# Patient Record
Sex: Male | Born: 1985 | Race: White | Hispanic: No | Smoking: Never smoker
Health system: Southern US, Community
[De-identification: ages and names within clinical notes are randomized; demographics above are authoritative.]

---

## 2006-06-25 ENCOUNTER — Encounter: Admission: RE | Admit: 2006-06-25 | Discharge: 2006-06-25 | Payer: Self-pay | Admitting: Sports Medicine

## 2008-09-17 IMAGING — RF DG FLUORO GUIDE NDL PLC/BX
5 series · 5 of 5 positions shown · IV contrast (magnevist)
Comparison: none

CLINICAL DATA: The patient is a pitcher.  Has elbow pain. 
 RIGHT ELBOW INJECTION PRIOR TO MR: 
 Using fluoroscopic guidance and sterile technique, a 25 gauge needle was placed into the elbow along the radial capitellar articulation.  The joint was injected with a 10 cc mixture containing 5 cc Hypaque 60 contrast and 5 cc 1% Lidocaine and .1 cc Magnevist.  The elbow was evaluated through flexion and extension.  I question whether there is an ulnar collateral ligament tear.  MR to follow.

[Series 1: (hospital) · 1 of 1 slices shown (1 of 2)]
[im 1/1]
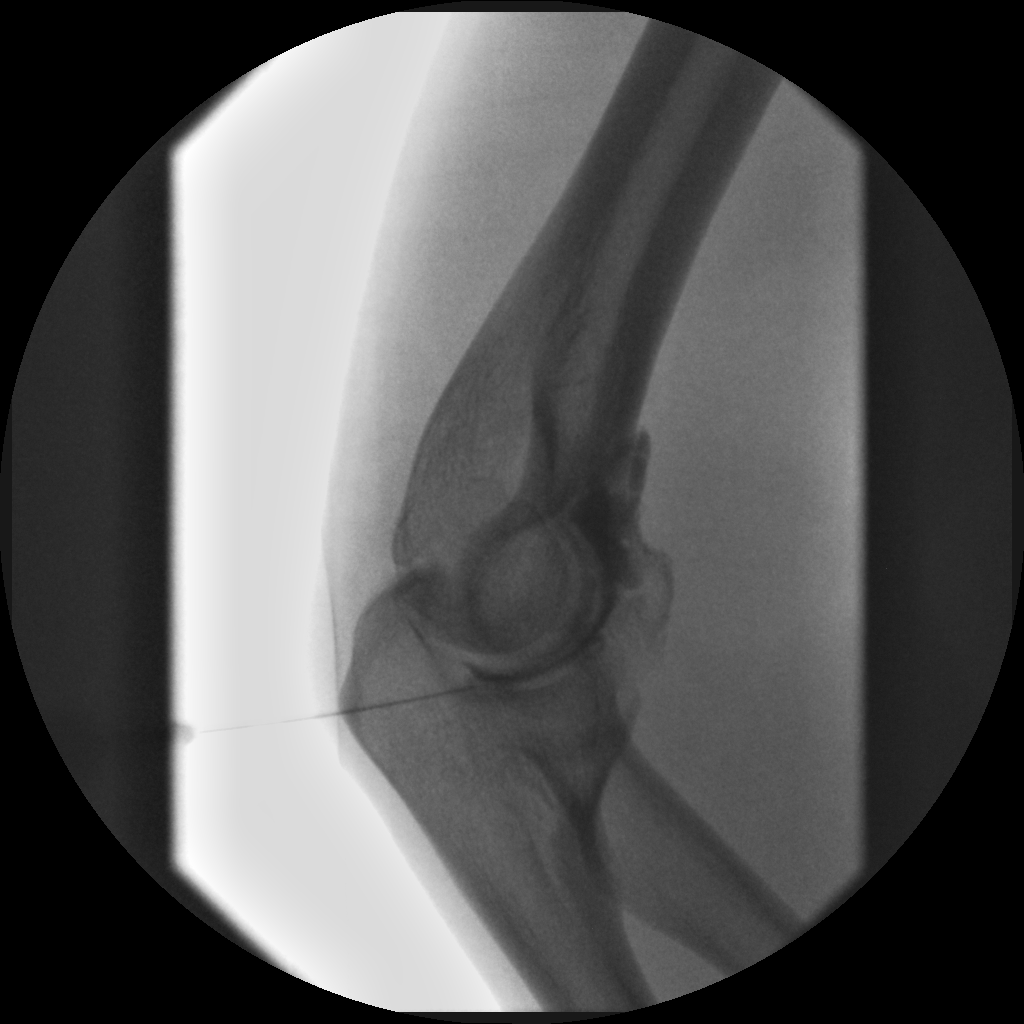

[Series 2: arthrogram · 1 of 1 slices shown (1 of 3)]
[im 1/1]
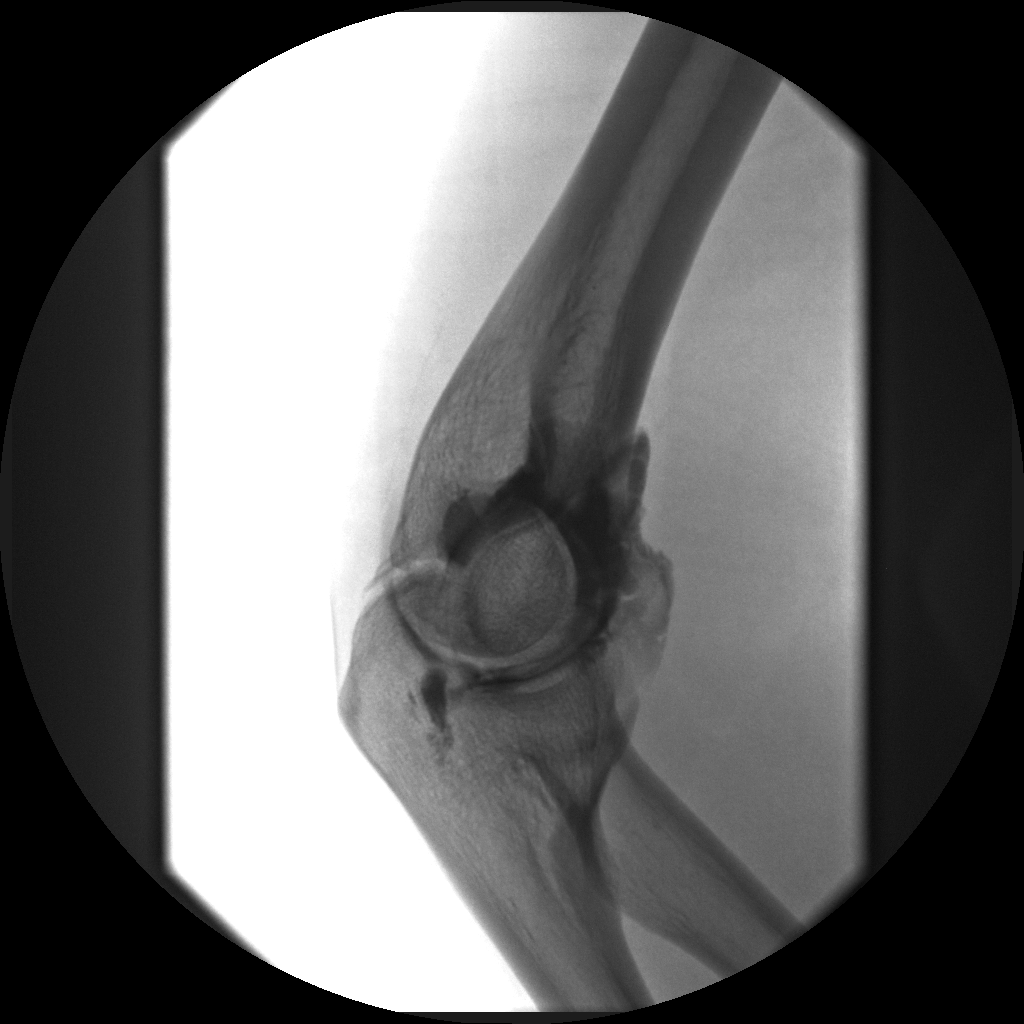

[Series 3: arthrogram · 1 of 1 slices shown (2 of 3)]
[im 1/1]
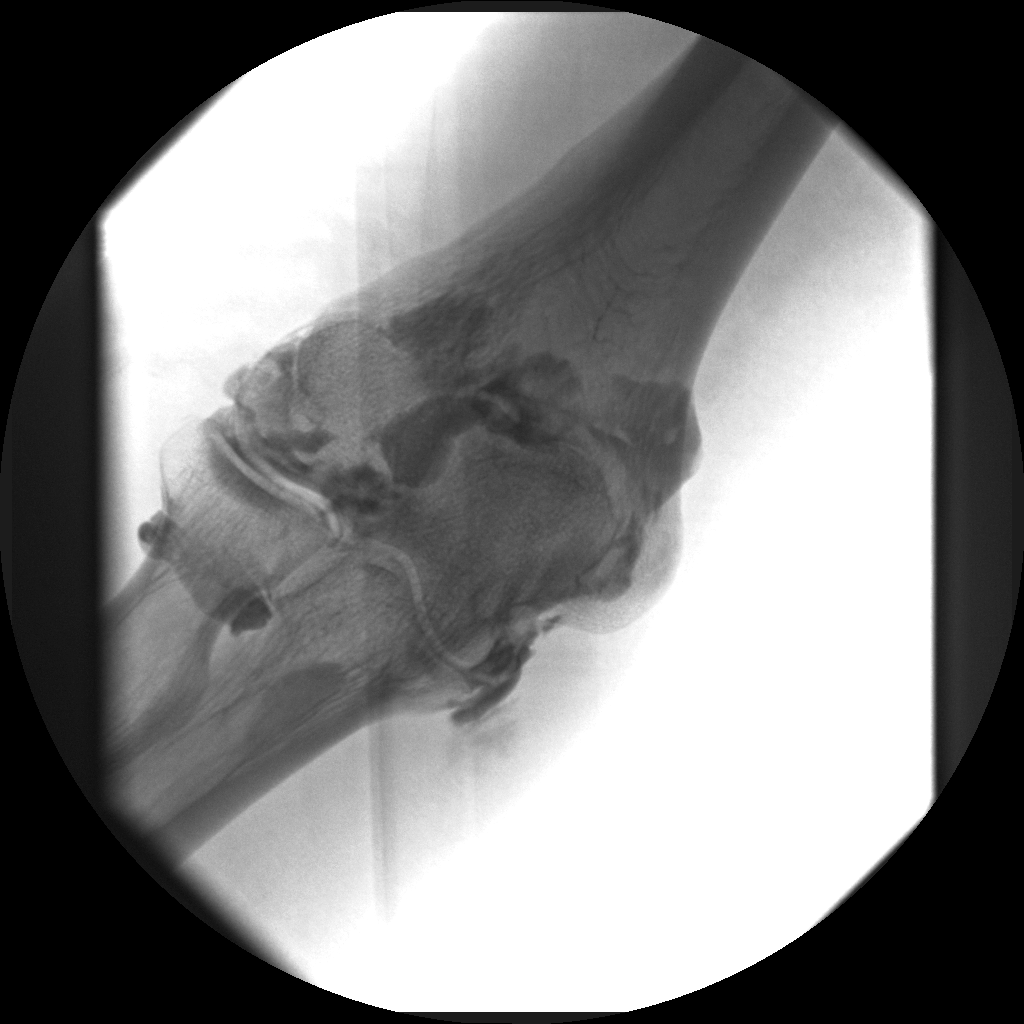

[Series 4: arthrogram · 1 of 1 slices shown (3 of 3)]
[im 1/1]
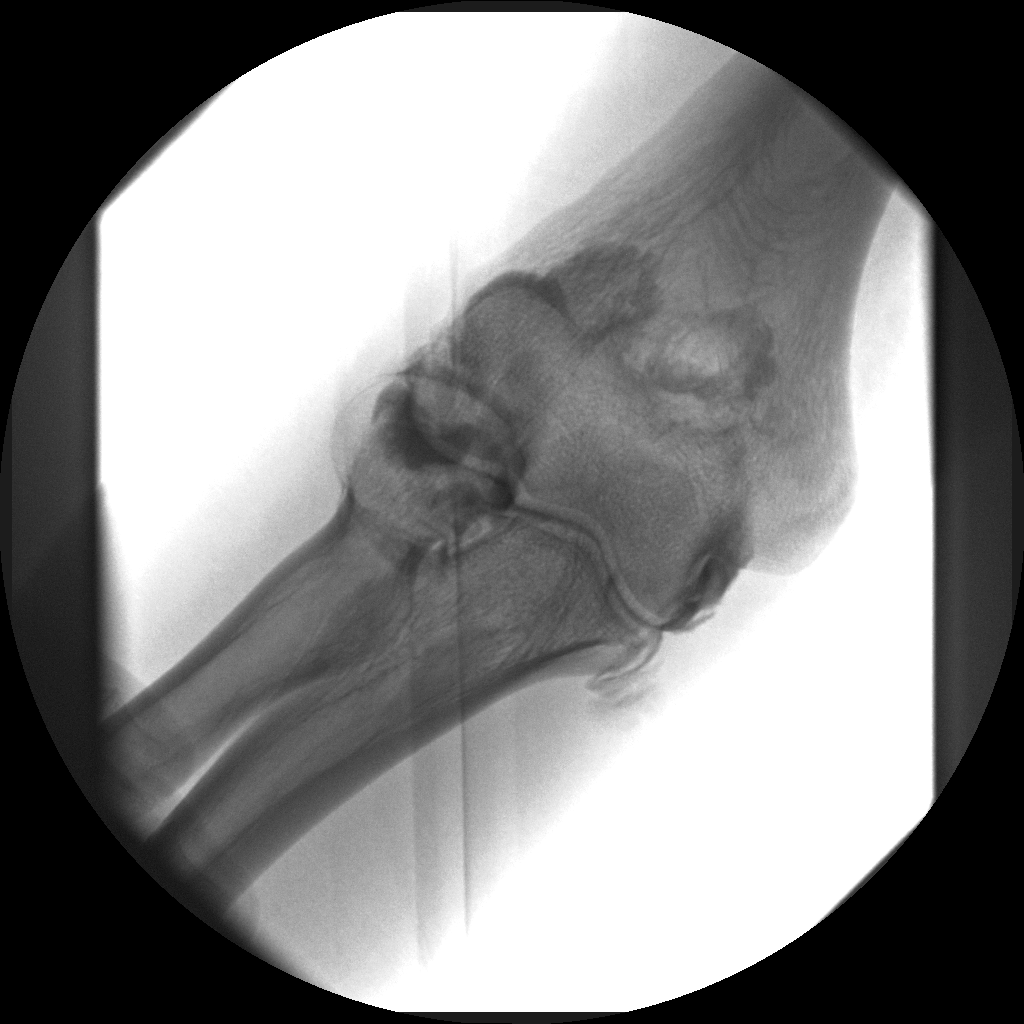

[Series 5: (hospital) · 1 of 1 slices shown (2 of 2)]
[im 1/1]
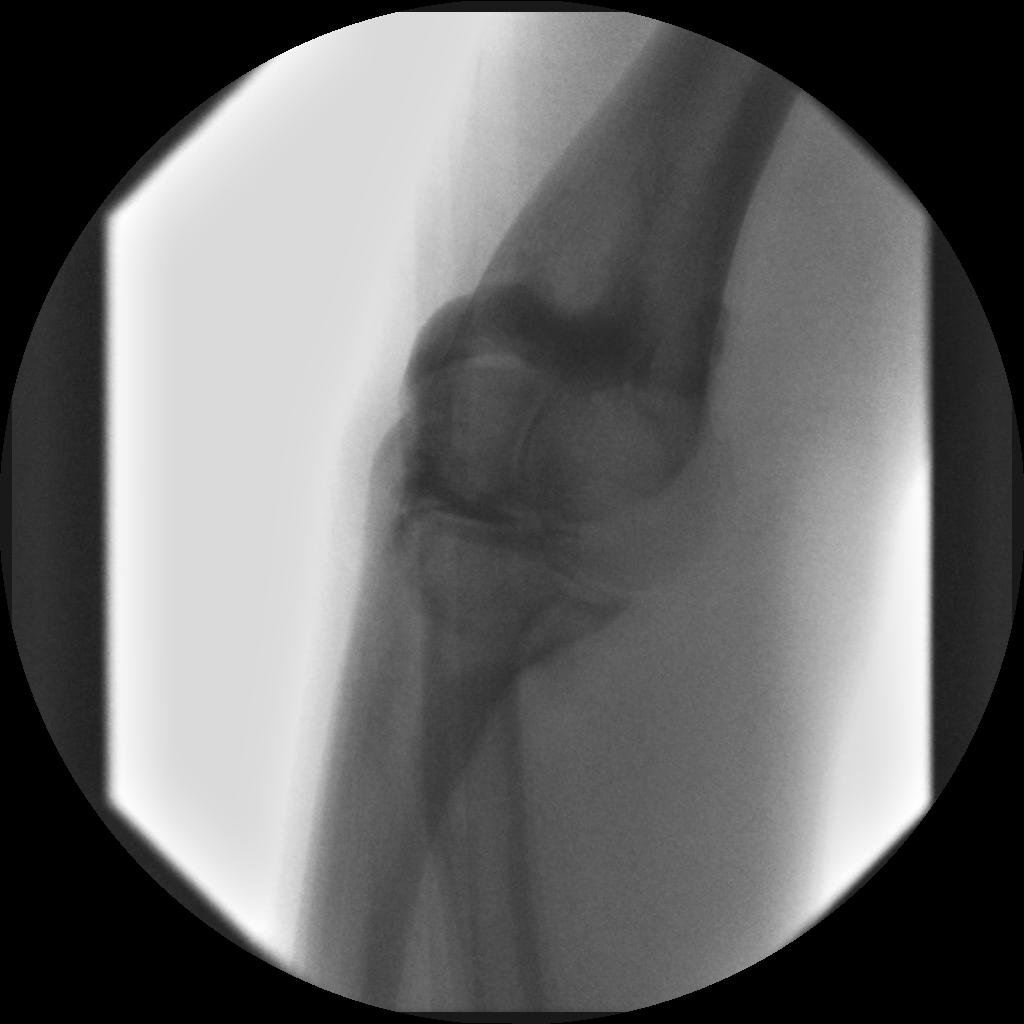

[5 of 5 positions shown; findings below may reference images not displayed]

IMPRESSION: INJECTION OF THE RIGHT ELBOW 
 Technically successful.  MR to follow.

## 2016-06-14 ENCOUNTER — Encounter (HOSPITAL_COMMUNITY): Payer: Self-pay | Admitting: Emergency Medicine

## 2016-06-14 ENCOUNTER — Ambulatory Visit (HOSPITAL_COMMUNITY)
Admission: EM | Admit: 2016-06-14 | Discharge: 2016-06-14 | Disposition: A | Discharge status: Home / Self Care | Payer: Managed Care, Other (non HMO)

## 2016-06-14 DIAGNOSIS — R509 Fever, unspecified: Secondary | ICD-10-CM

## 2016-06-14 DIAGNOSIS — R6889 Other general symptoms and signs: Secondary | ICD-10-CM

## 2016-06-14 MED ORDER — OSELTAMIVIR PHOSPHATE 75 MG PO CAPS: 75.0000 mg | ORAL_CAPSULE | Freq: Two times a day (BID) | ORAL | 0 refills | Status: AC

## 2016-06-14 NOTE — ED Provider Notes (Signed)
CSN: [id number]     Arrival date & time 06/14/16  1720 History   First MD Initiated Contact with Patient 06/14/16 1746     Chief Complaint  Patient presents with  . flu symptoms   (Consider location/radiation/quality/duration/timing/severity/associated sxs/prior Treatment) Patient has fever, chills, myalgias, and arthralgias.  Patient developed these sx's last night.  He has been exposed to family who have flu.   The history is provided by the patient.  Fever  Temp source:  Subjective Severity:  Moderate Onset quality:  Sudden Duration:  24 hours Timing:  Constant Progression:  Worsening Chronicity:  New Relieved by:  Nothing Worsened by:  Nothing Associated symptoms: myalgias     History reviewed. No pertinent past medical history. History reviewed. No pertinent surgical history. History reviewed. No pertinent family history. Social History  Substance Use Topics  . Smoking status: Never Smoker  . Smokeless tobacco: Never Used  . Alcohol use Yes    Review of Systems  Constitutional: Positive for fever.  HENT: Negative.   Eyes: Negative.   Respiratory: Negative.   Cardiovascular: Negative.   Gastrointestinal: Negative.   Endocrine: Negative.   Genitourinary: Negative.   Musculoskeletal: Positive for arthralgias and myalgias.  Skin: Negative.   Allergic/Immunologic: Negative.   Neurological: Negative.   Hematological: Negative.   Psychiatric/Behavioral: Negative.     Allergies  Patient has no known allergies.  Home Medications   Prior to Admission medications   Medication Sig Start Date End Date Taking? Authorizing Provider  acetaminophen (TYLENOL) 325 MG tablet Take 650 mg by mouth every 6 (six) hours as needed for fever.   Yes Historical Provider, MD  oseltamivir (TAMIFLU) 75 MG capsule Take 1 capsule (75 mg total) by mouth every 12 (twelve) hours. 06/14/16   Deatra Canter, FNP   Meds Ordered and Administered this Visit  Medications - No data to  display  BP 123/84 (BP Location: Right Arm)   Pulse 92   Temp 99.9 F (37.7 C) (Oral)   SpO2 95%  No data found.   Physical Exam  Constitutional: He is oriented to person, place, and time. He appears well-developed and well-nourished.  HENT:  Head: Normocephalic and atraumatic.  Right Ear: External ear normal.  Left Ear: External ear normal.  Mouth/Throat: Oropharynx is clear and moist.  Eyes: Conjunctivae and EOM are normal. Pupils are equal, round, and reactive to light.  Neck: Normal range of motion. Neck supple.  Cardiovascular: Normal rate, regular rhythm and normal heart sounds.   Pulmonary/Chest: Effort normal and breath sounds normal.  Abdominal: Soft.  Musculoskeletal: Normal range of motion.  Neurological: He is alert and oriented to person, place, and time.  Nursing note and vitals reviewed.   Urgent Care Course     Procedures (including critical care time)  Labs Review Labs Reviewed - No data to display  Imaging Review No results found.   Visual Acuity Review  Right Eye Distance:   Left Eye Distance:   Bilateral Distance:    Right Eye Near:   Left Eye Near:    Bilateral Near:         MDM   1. Flu-like symptoms   2. Fever, unspecified fever cause    Tamiflu  one po bid x 5 days #10  Push po fluids, rest, tylenol and motrin otc prn as directed for fever, arthralgias, and myalgias.  Follow up prn if sx's continue or persist.    Deatra Canter, FNP 06/14/16 [phone]

## 2016-06-14 NOTE — ED Triage Notes (Addendum)
Pt reports body aches last night and the start of a cough.  This morning he woke up feeling hot and states his fever was 102.3.  He also reports diarrhea last night.  Pt reports someone in his home that was diagnosed with the flu on Monday.
